# Patient Record
Sex: Male | Born: 1985 | Race: White | Hispanic: No | Marital: Married | Smoking: Never smoker
Health system: Southern US, Community
[De-identification: ages and names within clinical notes are randomized; demographics above are authoritative.]

## PROBLEM LIST (undated history)

## (undated) DIAGNOSIS — J45909 Unspecified asthma, uncomplicated: Secondary | ICD-10-CM

## (undated) HISTORY — DX: Unspecified asthma, uncomplicated: J45.909

---
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
## 2010-10-06 ENCOUNTER — Other Ambulatory Visit (HOSPITAL_COMMUNITY): Payer: Self-pay

## 2010-10-06 ENCOUNTER — Emergency Department (HOSPITAL_COMMUNITY): Payer: No Typology Code available for payment source

## 2010-10-06 ENCOUNTER — Inpatient Hospital Stay (HOSPITAL_COMMUNITY): Payer: No Typology Code available for payment source

## 2010-10-06 ENCOUNTER — Emergency Department (HOSPITAL_COMMUNITY): DRG: 963 | Payer: No Typology Code available for payment source | Attending: General Surgery

## 2010-10-06 ENCOUNTER — Inpatient Hospital Stay (HOSPITAL_COMMUNITY)
Admission: EM | Admit: 2010-10-06 | Discharge: 2010-10-10 | Disposition: A | Discharge status: Rehabilitation Facility | Payer: No Typology Code available for payment source | Source: Home / Self Care

## 2010-10-06 DIAGNOSIS — S12300A Unspecified displaced fracture of fourth cervical vertebra, initial encounter for closed fracture: Secondary | ICD-10-CM | POA: Diagnosis present

## 2010-10-06 DIAGNOSIS — S02109A Fracture of base of skull, unspecified side, initial encounter for closed fracture: Secondary | ICD-10-CM | POA: Diagnosis present

## 2010-10-06 DIAGNOSIS — J9600 Acute respiratory failure, unspecified whether with hypoxia or hypercapnia: Secondary | ICD-10-CM | POA: Diagnosis present

## 2010-10-06 DIAGNOSIS — J45909 Unspecified asthma, uncomplicated: Secondary | ICD-10-CM | POA: Diagnosis present

## 2010-10-06 DIAGNOSIS — S27329A Contusion of lung, unspecified, initial encounter: Secondary | ICD-10-CM | POA: Diagnosis present

## 2010-10-06 DIAGNOSIS — S065X9A Traumatic subdural hemorrhage with loss of consciousness of unspecified duration, initial encounter: Secondary | ICD-10-CM | POA: Diagnosis present

## 2010-10-06 LAB — BLOOD GAS, ARTERIAL
Acid-base deficit: 3.6 mmol/L — ABNORMAL HIGH (ref 0.0–2.0)
FIO2: 0.4 %
MECHVT: 550 mL
O2 Saturation: 97.4 %
Patient temperature: 100.5
RATE: 16 resp/min
TCO2: 22.5 mmol/L (ref 0–100)
pCO2 arterial: 42.8 mmHg (ref 35.0–45.0)

## 2010-10-06 LAB — BASIC METABOLIC PANEL
Chloride: 110 mEq/L (ref 96–112)
GFR calc Af Amer: >60 mL/min (ref >60)
GFR calc non Af Amer: >60 mL/min (ref >60)
Potassium: 4.3 mEq/L (ref 3.5–5.1)
Sodium: 140 mEq/L (ref 135–145)

## 2010-10-06 LAB — POCT I-STAT 3, ART BLOOD GAS (G3+)
Acid-base deficit: 2 mmol/L (ref 0.0–2.0)
O2 Saturation: 100 %
O2 Saturation: 96 %
Patient temperature: 98.8
TCO2: 24 mmol/L (ref 0–100)
TCO2: 24 mmol/L (ref 0–100)
pCO2 arterial: 54.8 mmHg — ABNORMAL HIGH (ref 35.0–45.0)
pCO2 arterial: 40.5 mmHg (ref 35.0–45.0)

## 2010-10-06 LAB — MRSA PCR SCREENING: MRSA by PCR: NEGATIVE

## 2010-10-06 LAB — POCT I-STAT, CHEM 8
BUN: 15 mg/dL (ref 6–23)
Creatinine, Ser: 1.6 mg/dL — ABNORMAL HIGH (ref 0.4–1.5)
Glucose, Bld: 192 mg/dL — ABNORMAL HIGH (ref 70–99)
Sodium: 140 meq/L (ref 135–145)
TCO2: 23 mmol/L (ref 0–100)

## 2010-10-06 LAB — CBC
Platelets: 205 10*3/uL (ref 150–400)
RBC: 4.88 MIL/uL (ref 4.22–5.81)
RDW: 12.8 % (ref 11.5–15.5)
WBC: 19.2 10*3/uL — ABNORMAL HIGH (ref 4.0–10.5)

## 2010-10-06 MED ORDER — IOHEXOL 300 MG/ML  SOLN
100.0000 mL | Freq: Once | INTRAMUSCULAR | Status: AC | PRN
  Administered 2010-10-06: 100 mL via INTRAVENOUS

## 2010-10-07 ENCOUNTER — Inpatient Hospital Stay (HOSPITAL_COMMUNITY): Payer: No Typology Code available for payment source

## 2010-10-07 LAB — CBC
Hemoglobin: 13.4 g/dL (ref 13.0–17.0)
MCH: 29.6 pg (ref 26.0–34.0)
MCV: 86.1 fL (ref 78.0–100.0)
RBC: 4.53 MIL/uL (ref 4.22–5.81)

## 2010-10-07 LAB — BLOOD GAS, ARTERIAL
Drawn by: 312761
O2 Content: 1 L/min
Patient temperature: 98.7
pCO2 arterial: 38.1 mmHg (ref 35.0–45.0)
pH, Arterial: 7.389 (ref 7.350–7.450)

## 2010-10-07 LAB — BASIC METABOLIC PANEL
CO2: 22 mEq/L (ref 19–32)
Calcium: 9 mg/dL (ref 8.4–10.5)
Chloride: 110 mEq/L (ref 96–112)
GFR calc non Af Amer: >60 mL/min (ref >60)
Glucose, Bld: 125 mg/dL — ABNORMAL HIGH (ref 70–99)

## 2010-10-08 DIAGNOSIS — S065X9A Traumatic subdural hemorrhage with loss of consciousness of unspecified duration, initial encounter: Secondary | ICD-10-CM

## 2010-10-08 LAB — BASIC METABOLIC PANEL
Calcium: 8.8 mg/dL (ref 8.4–10.5)
GFR calc non Af Amer: >60 mL/min (ref >60)
Glucose, Bld: 106 mg/dL — ABNORMAL HIGH (ref 70–99)
Potassium: 3.8 mEq/L (ref 3.5–5.1)
Sodium: 140 mEq/L (ref 135–145)

## 2010-10-08 LAB — CBC
MCHC: 34.2 g/dL (ref 30.0–36.0)
Platelets: 161 10*3/uL (ref 150–400)
RDW: 12.8 % (ref 11.5–15.5)
WBC: 11.4 10*3/uL — ABNORMAL HIGH (ref 4.0–10.5)
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
## 2010-10-10 ENCOUNTER — Inpatient Hospital Stay (HOSPITAL_COMMUNITY): Payer: BC Managed Care – PPO

## 2010-10-10 ENCOUNTER — Inpatient Hospital Stay (HOSPITAL_COMMUNITY)
Admission: RE | Admit: 2010-10-10 | Discharge: 2010-10-15 | DRG: 462 | Disposition: A | Discharge status: Home / Self Care | Payer: BC Managed Care – PPO | Source: Other Acute Inpatient Hospital | Attending: Physical Medicine & Rehabilitation | Admitting: Physical Medicine & Rehabilitation

## 2010-10-10 DIAGNOSIS — M545 Low back pain, unspecified: Secondary | ICD-10-CM | POA: Diagnosis present

## 2010-10-10 DIAGNOSIS — Z5189 Encounter for other specified aftercare: Principal | ICD-10-CM

## 2010-10-10 DIAGNOSIS — S069X9A Unspecified intracranial injury with loss of consciousness of unspecified duration, initial encounter: Secondary | ICD-10-CM

## 2010-10-10 DIAGNOSIS — R000 Tachycardia, unspecified: Secondary | ICD-10-CM | POA: Diagnosis present

## 2010-10-10 DIAGNOSIS — S27329A Contusion of lung, unspecified, initial encounter: Secondary | ICD-10-CM | POA: Diagnosis present

## 2010-10-10 DIAGNOSIS — F17200 Nicotine dependence, unspecified, uncomplicated: Secondary | ICD-10-CM | POA: Diagnosis present

## 2010-10-10 DIAGNOSIS — S12100A Unspecified displaced fracture of second cervical vertebra, initial encounter for closed fracture: Secondary | ICD-10-CM | POA: Diagnosis present

## 2010-10-10 DIAGNOSIS — S02109A Fracture of base of skull, unspecified side, initial encounter for closed fracture: Secondary | ICD-10-CM | POA: Diagnosis present

## 2010-10-10 DIAGNOSIS — Y998 Other external cause status: Secondary | ICD-10-CM

## 2010-10-10 DIAGNOSIS — J690 Pneumonitis due to inhalation of food and vomit: Secondary | ICD-10-CM | POA: Diagnosis present

## 2010-10-10 DIAGNOSIS — S22009A Unspecified fracture of unspecified thoracic vertebra, initial encounter for closed fracture: Secondary | ICD-10-CM

## 2010-10-10 DIAGNOSIS — S0280XA Fracture of other specified skull and facial bones, unspecified side, initial encounter for closed fracture: Secondary | ICD-10-CM

## 2010-10-10 DIAGNOSIS — Z4789 Encounter for other orthopedic aftercare: Secondary | ICD-10-CM

## 2010-10-10 DIAGNOSIS — S335XXA Sprain of ligaments of lumbar spine, initial encounter: Secondary | ICD-10-CM | POA: Diagnosis present

## 2010-10-10 DIAGNOSIS — S025XXA Fracture of tooth (traumatic), initial encounter for closed fracture: Secondary | ICD-10-CM | POA: Diagnosis present
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
## 2010-10-11 DIAGNOSIS — S22009A Unspecified fracture of unspecified thoracic vertebra, initial encounter for closed fracture: Secondary | ICD-10-CM

## 2010-10-11 DIAGNOSIS — S0280XA Fracture of other specified skull and facial bones, unspecified side, initial encounter for closed fracture: Secondary | ICD-10-CM

## 2010-10-11 DIAGNOSIS — S069X9A Unspecified intracranial injury with loss of consciousness of unspecified duration, initial encounter: Secondary | ICD-10-CM

## 2010-10-13 DIAGNOSIS — S22009A Unspecified fracture of unspecified thoracic vertebra, initial encounter for closed fracture: Secondary | ICD-10-CM

## 2010-10-13 DIAGNOSIS — S069X9A Unspecified intracranial injury with loss of consciousness of unspecified duration, initial encounter: Secondary | ICD-10-CM

## 2010-10-13 DIAGNOSIS — S0280XA Fracture of other specified skull and facial bones, unspecified side, initial encounter for closed fracture: Secondary | ICD-10-CM

## 2010-10-13 LAB — CBC
HCT: 43 % (ref 39.0–52.0)
Hemoglobin: 14.8 g/dL (ref 13.0–17.0)
MCH: 28.6 pg (ref 26.0–34.0)
MCHC: 34.4 g/dL (ref 30.0–36.0)
MCV: 83 fL (ref 78.0–100.0)
Platelets: 267 10*3/uL (ref 150–400)
RBC: 5.18 MIL/uL (ref 4.22–5.81)
RDW: 12.4 % (ref 11.5–15.5)
WBC: 11.2 10*3/uL — ABNORMAL HIGH (ref 4.0–10.5)

## 2010-10-13 LAB — COMPREHENSIVE METABOLIC PANEL
ALT: 50 U/L (ref 0–53)
BUN: 12 mg/dL (ref 6–23)
Calcium: 9.6 mg/dL (ref 8.4–10.5)
Creatinine, Ser: 0.85 mg/dL (ref 0.4–1.5)
Glucose, Bld: 109 mg/dL — ABNORMAL HIGH (ref 70–99)
Sodium: 141 mEq/L (ref 135–145)
Total Protein: 7.3 g/dL (ref 6.0–8.3)

## 2010-10-13 LAB — DIFFERENTIAL
Basophils Absolute: 0.1 10*3/uL (ref 0.0–0.1)
Basophils Relative: 0 % (ref 0–1)
Eosinophils Absolute: 0.2 10*3/uL (ref 0.0–0.7)
Eosinophils Relative: 2 % (ref 0–5)
Lymphocytes Relative: 16 % (ref 12–46)
Lymphs Abs: 1.8 10*3/uL (ref 0.7–4.0)
Monocytes Absolute: 0.8 10*3/uL (ref 0.1–1.0)
Monocytes Relative: 7 % (ref 3–12)
Neutro Abs: 8.4 10*3/uL — ABNORMAL HIGH (ref 1.7–7.7)
Neutrophils Relative %: 75 % (ref 43–77)

## 2010-10-14 ENCOUNTER — Inpatient Hospital Stay (HOSPITAL_COMMUNITY): Payer: BC Managed Care – PPO
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
## 2010-10-23 ENCOUNTER — Ambulatory Visit: Payer: BC Managed Care – PPO | Attending: Physical Medicine & Rehabilitation

## 2010-10-23 DIAGNOSIS — IMO0001 Reserved for inherently not codable concepts without codable children: Secondary | ICD-10-CM | POA: Insufficient documentation

## 2010-10-23 DIAGNOSIS — R41841 Cognitive communication deficit: Secondary | ICD-10-CM | POA: Insufficient documentation
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
## 2010-11-03 ENCOUNTER — Ambulatory Visit: Payer: BC Managed Care – PPO | Attending: Physical Medicine & Rehabilitation | Admitting: Speech Pathology

## 2010-11-03 DIAGNOSIS — R41841 Cognitive communication deficit: Secondary | ICD-10-CM | POA: Insufficient documentation

## 2010-11-03 DIAGNOSIS — IMO0001 Reserved for inherently not codable concepts without codable children: Secondary | ICD-10-CM | POA: Insufficient documentation

## 2010-11-05 LAB — TYPE AND SCREEN
ABO/RH(D): O POS
Unit division: 0

## 2010-11-05 LAB — PROTIME-INR: Prothrombin Time: 13.9 s (ref 11.6–15.2)

## 2010-11-05 LAB — COMPREHENSIVE METABOLIC PANEL
ALT: 199 U/L — ABNORMAL HIGH (ref 0–53)
Alkaline Phosphatase: 60 U/L (ref 39–117)
BUN: 12 mg/dL (ref 6–23)
CO2: 24 meq/L (ref 19–32)
Calcium: 7.9 mg/dL — ABNORMAL LOW (ref 8.4–10.5)
GFR calc non Af Amer: >60 mL/min (ref >60)
Glucose, Bld: 202 mg/dL — ABNORMAL HIGH (ref 70–99)
Potassium: 3.5 meq/L (ref 3.5–5.1)
Total Protein: 5.9 g/dL — ABNORMAL LOW (ref 6.0–8.3)

## 2010-11-05 LAB — CBC
HCT: 41.4 % (ref 39.0–52.0)
MCH: 29.2 pg (ref 26.0–34.0)
MCV: 84.5 fL (ref 78.0–100.0)
Platelets: 251 10*3/uL (ref 150–400)
RDW: 12.5 % (ref 11.5–15.5)

## 2010-11-05 LAB — LACTIC ACID, PLASMA: Lactic Acid, Venous: 3.3 mmol/L — ABNORMAL HIGH (ref 0.5–2.2)

## 2010-11-10 DIAGNOSIS — R413 Other amnesia: Secondary | ICD-10-CM

## 2010-11-10 DIAGNOSIS — S069X9A Unspecified intracranial injury with loss of consciousness of unspecified duration, initial encounter: Secondary | ICD-10-CM

## 2010-12-01 ENCOUNTER — Encounter
Payer: BC Managed Care – PPO | Attending: Physical Medicine & Rehabilitation | Admitting: Physical Medicine & Rehabilitation

## 2010-12-01 DIAGNOSIS — S12300A Unspecified displaced fracture of fourth cervical vertebra, initial encounter for closed fracture: Secondary | ICD-10-CM

## 2010-12-01 DIAGNOSIS — R413 Other amnesia: Secondary | ICD-10-CM | POA: Insufficient documentation

## 2010-12-01 DIAGNOSIS — S069X9A Unspecified intracranial injury with loss of consciousness of unspecified duration, initial encounter: Secondary | ICD-10-CM | POA: Insufficient documentation

## 2010-12-01 DIAGNOSIS — S82209A Unspecified fracture of shaft of unspecified tibia, initial encounter for closed fracture: Secondary | ICD-10-CM | POA: Insufficient documentation
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
## 2021-06-25 NOTE — Progress Notes (Signed)
NEW PATIENT Date of Service/Encounter:  06/30/21 Referring provider: Kem Boroughs, MD Primary care provider: Kem Boroughs, MD  Subjective:  [place] Holloway is a 35 y.o. male with a PMHx of restless leg syndrome,  presenting today for evaluation of concern for ant bites. History obtained from: chart review and patient.   Ant bites:  Has had several ant bites which lead to swelling occurring several hours after bite.  Usually will get bit on hands from wearing gloves and doing yard work.  Most recently, also developed difficult breathing and full extremity pain and swelling.  He takes Benadryl as soon as he gets bit. He works as a Therapist, occupational. He does not carry an Epipen. He has been stung by wasp last year and developed a localized welp-but no other symptoms.  He does take Claritin for seasonal allergies.  Gets itchy eyes.  This is the main symptom.  Also gets nasal congestion.  Also gets itchy sometimes from touching random things such as the carpet.  Feels pretty controlled with the Claritin.  Has history of childhood asthma, but no longer an active issue.  He has not used albuterol in many years, since before his teenage years.  He does have extremely dry skin during winter months.  He uses an ointment - Cetaphil.  He has a newborn baby in his home and has been washing his hands constantly.  This is caused his hands to become dry, crack and bleed.  This is a chronic issue that is only in problem during winter and cold months.  He never gets red bumps or blisters on his hands-just dryness and cracking.  Other allergy screening: Food allergy: no   Past Medical History: Past Medical History:  Diagnosis Date   Asthma    Medication List:  Current Outpatient Medications  Medication Sig Dispense Refill   clobetasol ointment (TEMOVATE) 0.05 % Apply topically twice daily to BODY as needed for SEVERE red, sandpaper and thickened like rash.  Do not use on face, groin or armpits.  Use  for up to two weeks at a time. 60 g 1   EPINEPHrine (EPIPEN 2-PAK) 0.3 mg/0.3 mL IJ SOAJ injection Inject 0.3 mg into the muscle once for 1 dose. 1 each 1   Loratadine (CLARITIN) 10 MG CAPS Take by mouth.     Multiple Vitamin (MULTI-VITAMIN) tablet Take 1 tablet by mouth daily.     No current facility-administered medications for this visit.   Known Allergies:  Not on File Past Surgical History: Past Surgical History:  Procedure Laterality Date   WISDOM TOOTH EXTRACTION Bilateral    Family History: Family History  Problem Relation Age of Onset   Asthma Mother    Migraines Mother    Asthma Maternal Grandmother    Allergic rhinitis Neg Hx    Eczema Neg Hx    Immunodeficiency Neg Hx    Urticaria Neg Hx    Angioedema Neg Hx    Social History: [place] lives In a house built 14 years ago, no water damage, carpet in the bedroom, wood and heat pump for heating, heat pump for cooling, pets dog and cat indoors, chickens outdoor, no pests, not using dust mite protection, no smoke exposure.  He works as a Engineer, structural for 4 years.  No HEPA filter.  [place]   ROS:  All other systems negative except as noted per HPI.  Objective:  Blood pressure 112/76, pulse 98, temperature 98.2 F (36.8 C), temperature  source Temporal, resp. rate 16, height 6\' 1"  (1.854 m), weight 228 lb 3.2 oz (103.5 kg), SpO2 99 %. Body mass index is 30.11 kg/m. Physical Exam:  General Appearance:  Alert, cooperative, no distress, appears stated age  Head:  Normocephalic, without obvious abnormality, atraumatic  Eyes:  Conjunctiva clear, EOM's intact  Nose: Nares normal, bilateral hypertrophic turbinates, no visible anterior polyps  Throat: Lips, tongue normal; teeth and gums normal, normal posterior oropharynx  Neck: Supple, symmetrical  Lungs:   Clear to auscultation bilaterally, respirations unlabored, no coughing  Heart:  Regular rate and rhythm, no murmur, appears well  perfused  Extremities: No edema  Skin: Skin color, texture, turgor normal, no rashes or lesions on visualized portions of skin  Neurologic: No gross deficits     Diagnostics: Spirometry:  Tracings reviewed. His effort: Good reproducible efforts. FVC: 5.44L FEV1: 4.07L, 84% predicted FEV1/FVC ratio: 91% Interpretation: Spirometry consistent with normal pattern.  Please see scanned spirometry results for details.  Skin Testing:  Environmental allergy panel plus fire ant . Positive test to: Fire ants, weeds, grasses, trees, molds, dust mites, cats and dogs, mixed feathers. Negative test to: All others.  Results discussed with patient/family.  Airborne Adult Perc - 06/30/21 1501     Time Antigen Placed --    Allergen Manufacturer --    Location --    Number of Test --    Panel 1 --             Food Adult Perc - 06/30/21 1400     Time Antigen Placed 0230    Allergen Manufacturer 07/02/21    Location Back    Number of allergen test 1    6. Other 3+   fire ant   7. Other Omitted    8. Other Omitted             Allergy testing results were read and interpreted by myself, documented by clinical staff.  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 - recommend allergy shots directed toward fire ants - please carry Epinephrine autoinjector at all times in case of accidental sting, to be used if stung and has SKIN AND ANY OTHER SYMPTOMS - for SKIN only, can take Benadryl 2 tsp (25 mg)  - recommend medical alert bracelet - practice avoidance measures when possible   Chronic Rhinitis and Conjunctivitis: -Seasonal and perennial allergic rhinitis, partially controlled - allergy testing today was positive to weeds, grasses, trees, molds, dust mites, cats and dogs, mixed feathers - allergen avoidance as below - consider allergy shots as long term control of your symptoms by teaching your immune system to be  more tolerant of your allergy triggers - Continue over the counter antihistamine daily or daily as needed.   -Your options include Zyrtec (Cetirizine) 10mg , Claritin (Loratadine) 10mg , Allegra (Fexofenadine) 180mg , or Xyzal (Levocetirinze) 5mg  - if not controlled, consider nasal steroid spray such as   Allergic Conjunctivitis: Partially controlled - Consider Allergy Eye drops: great options include Pataday (Olopatadine) or Zaditor (ketotifen) for eye symptoms daily as needed-both sold over the counter if not covered by insurance.   -Avoid eye drops that say red eye relief as they may contain medications that dry out your eyes.  Hand Dermatitis:  -Uncontrolled Daily Care For Maintenance (daily and continue even once eczema controlled) - Use hypoallergenic hydrating ointment at least twice daily.  This must be done daily for control of  flares. (Great options include Vaseline, CeraVe, Aquaphor, Aveeno, Cetaphil, etc) - Avoid detergents, soaps or lotions with fragrances/dyes - Limit showers/baths to 5 minutes and use luke warm water instead of hot, pat dry following baths, and apply moisturizer - can use steroid creams as detailed below up to twice weekly for prevention of flares.  For Flares:(add this to maintenance therapy if needed for flares) First apply steroid creams. Wait 5 minutes then apply moisturizer.  - Clobetasol 0.05% to hands for severe flares-apply topically twice daily to red, raised, thickened areas of skin, followed by moisturizer. Do not use on face, groin or armpits - for itching, can increase to 2 daily claritin-one in morning and one in evening  Follow-up in 3 months.  Sooner for allergy shots to fire ant. - This note in its entirety was forwarded to the Provider who requested this consultation.  Thank you for your kind referral. I appreciate the opportunity to take part in [place]ly's care. Please do not hesitate to contact me with questions.  Sincerely,  Tonny Bollman,  MD [facility]

## 2021-06-30 ENCOUNTER — Ambulatory Visit (INDEPENDENT_AMBULATORY_CARE_PROVIDER_SITE_OTHER): Payer: BC Managed Care – PPO | Admitting: Internal Medicine

## 2021-06-30 ENCOUNTER — Encounter: Payer: Self-pay | Admitting: Internal Medicine

## 2021-06-30 ENCOUNTER — Other Ambulatory Visit: Payer: Self-pay

## 2021-06-30 VITALS — BP 112/76 | HR 98 | Temp 98.2°F | Resp 16 | Ht 73.0 in | Wt 228.2 lb

## 2021-06-30 DIAGNOSIS — T782XXA Anaphylactic shock, unspecified, initial encounter: Secondary | ICD-10-CM

## 2021-06-30 DIAGNOSIS — J4520 Mild intermittent asthma, uncomplicated: Secondary | ICD-10-CM

## 2021-06-30 DIAGNOSIS — T63481A Toxic effect of venom of other arthropod, accidental (unintentional), initial encounter: Secondary | ICD-10-CM | POA: Diagnosis not present

## 2021-06-30 DIAGNOSIS — J310 Chronic rhinitis: Secondary | ICD-10-CM | POA: Diagnosis not present

## 2021-06-30 DIAGNOSIS — J3089 Other allergic rhinitis: Secondary | ICD-10-CM

## 2021-06-30 DIAGNOSIS — J302 Other seasonal allergic rhinitis: Secondary | ICD-10-CM | POA: Insufficient documentation

## 2021-06-30 MED ORDER — CLOBETASOL PROPIONATE 0.05 % EX OINT
TOPICAL_OINTMENT | CUTANEOUS | 1 refills | Status: DC
Start: 2021-06-30 — End: 2021-10-02

## 2021-06-30 MED ORDER — EPINEPHRINE 0.3 MG/0.3ML IJ SOAJ: 0.3000 mg | Freq: Once | INTRAMUSCULAR | 1 refills | Status: AC

## 2021-06-30 NOTE — Patient Instructions (Addendum)
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 - recommend allergy shots directed toward fire ants - please carry Epinephrine autoinjector at all times in case of accidental sting, to be used if stung and has SKIN AND ANY OTHER SYMPTOMS - for SKIN only, can take Benadryl 2 tsp (25 mg)  - recommend medical alert bracelet - practice avoidance measures when possible   Chronic Rhinitis and Conjunctivitis: - allergy testing today was positive to weeds, grasses, trees, molds, dust mites, cats and dogs, mixed feathers - allergen avoidance as below - consider allergy shots as long term control of your symptoms by teaching your immune system to be more tolerant of your allergy triggers - Continue over the counter antihistamine daily or daily as needed.   -Your options include Zyrtec (Cetirizine) 10mg , Claritin (Loratadine) 10mg , Allegra (Fexofenadine) 180mg , or Xyzal (Levocetirinze) 5mg  - if not controlled, consider nasal steroid spray such as   Allergic Conjunctivitis:  - Consider Allergy Eye drops: great options include Pataday (Olopatadine) or Zaditor (ketotifen) for eye symptoms daily as needed-both sold over the counter if not covered by insurance.   -Avoid eye drops that say red eye relief as they may contain medications that dry out your eyes.  Hand Dermatitis:  Daily Care For Maintenance (daily and continue even once eczema controlled) - Use hypoallergenic hydrating ointment at least twice daily.  This must be done daily for control of flares. (Great options include Vaseline, CeraVe, Aquaphor, Aveeno, Cetaphil, etc) - Avoid detergents, soaps or lotions with fragrances/dyes - Limit showers/baths to 5 minutes and use luke warm water instead of hot, pat dry following baths, and apply moisturizer - can use steroid creams as detailed below up to twice weekly for prevention of flares.  For Flares:(add this to maintenance therapy if needed for flares) First apply steroid  creams. Wait 5 minutes then apply moisturizer.  - Clobetasol 0.05% to hands for severe flares-apply topically twice daily to red, raised, thickened areas of skin, followed by moisturizer. Do not use on face, groin or armpits - for itching, can increase to 2 daily claritin-one in morning and one in evening  Follow-up in 3 months. ------------------------------------------------------------------------------- Reducing Pollen Exposure  The American Academy of Allergy, Asthma and Immunology suggests the following steps to reduce your exposure to pollen during allergy seasons.    Do not hang sheets or clothing out to dry; pollen may collect on these items. Do not mow lawns or spend time around freshly cut grass; mowing stirs up pollen. Keep windows closed at night.  Keep car windows closed while driving. Minimize morning activities outdoors, a time when pollen counts are usually at their highest. Stay indoors as much as possible when pollen counts or humidity is high and on windy days when pollen tends to remain in the air longer. Use air conditioning when possible.  Many air conditioners have filters that trap the pollen spores. Use a HEPA room air filter to remove pollen form the indoor air you breathe.  Control of Dog or Cat Allergen  Avoidance is the best way to manage a dog or cat allergy. If you have a dog or cat and are allergic to dog or cats, consider removing the dog or cat from the home. If you have a dog or cat but don't want to find it a new home, or if your family wants a pet even though someone in the household is allergic, here are some strategies that may help keep symptoms at bay:  Keep the pet out of your bedroom and restrict it to only a few rooms. Be advised that keeping the dog or cat in only one room will not limit the allergens to that room. Don't pet, hug or kiss the dog or cat; if you do, wash your hands with soap and water. High-efficiency particulate air (HEPA)  cleaners run continuously in a bedroom or living room can reduce allergen levels over time. Regular use of a high-efficiency vacuum cleaner or a central vacuum can reduce allergen levels. Giving your dog or cat a bath at least once a week can reduce airborne allergen.  DUST MITE AVOIDANCE MEASURES:  There are three main measures that need and can be taken to avoid house dust mites:  Reduce accumulation of dust in general -reduce furniture, clothing, carpeting, books, stuffed animals, especially in bedroom  Separate yourself from the dust -use pillow and mattress encasements (can be found at stores such as [employer] or online) -avoid direct exposure to air condition flow -use a HEPA filter device, especially in the bedroom; you can also use a HEPA filter vacuum cleaner -wipe dust with a moist towel instead of a dry towel or broom when cleaning  Decrease mites and/or their secretions -wash clothing and linen and stuffed animals at highest temperature possible, at least every 2 weeks -stuffed animals can also be placed in a bag and put in a freezer overnight  Despite the above measures, it is impossible to eliminate dust mites or their allergen completely from your home.  With the above measures the burden of mites in your home can be diminished, with the goal of minimizing your allergic symptoms.  Success will be reached only when implementing and using all means together.

## 2021-07-01 NOTE — Progress Notes (Signed)
Aeroallergen Immunotherapy   Ordering Provider: Dr. Tonny Bollman   Patient Details  Name: VUE PAVON  MRN: [id number]  Date of Birth: 01-Mar-1986   Order 1 of 1   Vial Label: Fire Ant   0.5 ml (Volume)  1:20 Concentration -- Fire Ant    0.5  ml Extract Subtotal  4.5  ml Diluent  5.0  ml Maintenance Total   Schedule:  B  Blue Vial (1:100,000): Schedule B (6 doses)  Yellow Vial (1:10,000): Schedule B (6 doses)  Green Vial (1:1,000): Schedule B (6 doses)  Red Vial (1:100): Schedule A (10 doses)   Special Instructions: normal B schedule

## 2021-07-01 NOTE — Progress Notes (Signed)
VIALS NOT MADE. APPT NOT SCHED °

## 2021-07-02 ENCOUNTER — Other Ambulatory Visit: Payer: Self-pay

## 2021-07-02 MED ORDER — EPINEPHRINE 0.3 MG/0.3ML IJ SOAJ: 0.3000 mg | Freq: Once | INTRAMUSCULAR | 1 refills | Status: AC

## 2021-07-14 ENCOUNTER — Ambulatory Visit: Payer: BC Managed Care – PPO | Admitting: Internal Medicine

## 2021-09-30 NOTE — Progress Notes (Signed)
? ?FOLLOW UP ?Date of Service/Encounter:  10/02/21 ? ? ?Subjective:  ?Bradley Holloway (DOB: Aug 06, 1985) is a 36 y.o. male who [facility] in re-evaluation of the following: fire ant allergy, seasonal and perennial allergic rhinitis and conjunctivitis, hand dermatitis ?History obtained from: chart review and patient. ? ?For Review, LV was on 06/30/21  with Dr.Kasyn Rolph.  Recommended fire ant allergy shots based on testing.   ? ? ?Pertinent History/Diagnostics:  ?- Asthma:  ? - 06/30/21 spirometry  ratio 91%, 84% FEV1  ?- Allergic Rhinitis:  ? - SPT environmental panel (06/30/21): weeds, grasses, trees, molds, dust mites, cats and dogs, mixed feathers. ?- Fire [employer] Allergy ? - Hx of reaction: swelling, hives, difficult breathing ? - SPT fire ant (06/30/21): 3+ ? ?Today he presents for follow-up. ?He has not had any fire ant bites since his last visit.  He has checked with his insurance and is going to start allergy shots for fire ant.  However he starts a new job and is waiting to get his final schedule so that he can make his first appointment. ?His environmental allergies have been overall well controlled with Zyrtec and Flonase 1 spray in each nostril daily.   ?He is getting shortness of breath when drastic weather changes or if really humid it makes him feels like his chest is heavy and the air is dense.  He has not had an albuterol inhaler in many years but did have asthma as a child and is wondering if he should carry 1 of these. ? ?Allergies as of 10/02/2021   ? ?   Reactions  ? Fire [employer]   ? Joint pain, difficult breathing  ? ?  ? ?  ?Medication List  ?  ? ?  ? Accurate as of October 02, 2021  6:16 PM. If you have any questions, ask your nurse or doctor.  ?  ?  ? ?  ? ?clobetasol ointment 0.05 % ?Commonly known as: TEMOVATE ?Apply topically twice daily to BODY as needed for SEVERE red, sandpaper and thickened like rash.  Do not use on face, groin or armpits.  Use for up to two  weeks at a time. ?  ?Loratadine 10 MG Caps ?Take by mouth. ?  ?Multi-Vitamin tablet ?Take 1 tablet by mouth daily. ?  ? ?  ? ?Past Medical History:  ?Diagnosis Date  ? Asthma   ? ?Past Surgical History:  ?Procedure Laterality Date  ? WISDOM TOOTH EXTRACTION Bilateral   ? ?Otherwise, there have been no changes to his past medical history, surgical history, family history, or social history. ? ?ROS: All others negative except as noted per HPI.  ? ?Objective:  ?BP 128/84 (BP Location: Right Arm, Patient Position: Sitting, Cuff Size: Normal)   Pulse 80   Temp 97.7 ?F (36.5 ?C) (Temporal)   Resp 20   Ht 6' (1.829 m)   Wt 232 lb 11.2 oz (105.6 kg)   SpO2 98%   BMI 31.56 kg/m?  ?Body mass index is 31.56 kg/m?Marland Kitchen ?Physical Exam: ?General Appearance:  Alert, cooperative, no distress, appears stated age  ?Head:  Normocephalic, without obvious abnormality, atraumatic  ?Eyes:  Conjunctiva clear, EOM's intact  ?Nose: Nares normal, hypertrophic turbinates, normal mucosa, no visible anterior polyps, and septum midline  ?Throat: Lips, tongue normal; teeth and gums normal, normal posterior oropharynx  ?Neck: Supple, symmetrical  ?Lungs:   clear to auscultation bilaterally, Respirations unlabored, no coughing  ?Heart:  regular rate  and rhythm and no murmur, Appears well perfused  ?Extremities: No edema  ?Skin: Skin color, texture, turgor normal, no rashes or lesions on visualized portions of skin  ?Neurologic: No gross deficits  ? ?Assessment/Plan  ? 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 ?Seasonal and perennial allergic rhinitis: ?- allergy testing was  positive to weeds, grasses, trees, molds, dust mites, cats and dogs, mixed feathers ?- allergen avoidance as below ?- consider allergy shots as long term control of your symptoms by teaching your immune system to be more tolerant of your allergy triggers ?- Continue over the counter antihistamine daily or daily as needed.   ?-Your options include Zyrtec (Cetirizine) 10mg , Claritin (Loratadine) 10mg , Allegra (Fexofenadine) 180mg , or Xyzal (Levocetirinze) 5mg  ?- Continue Flonase (fluticasone) 1-2 sprays daily as needed, best if used daily ? ?Allergic Conjunctivitis: controlled ?- Consider Allergy Eye drops: great options include Pataday (Olopatadine) or Zaditor (ketotifen) for eye symptoms daily as needed-both sold over the counter if not covered by insurance.   ?-Avoid eye drops that say red eye relief as they may contain medications that dry out your eyes. ? ?Hand Dermatitis: controlled ?Daily Care For Maintenance (daily and continue even once eczema controlled) ?- Use hypoallergenic hydrating ointment at least twice daily.  This must be done daily for control of flares. (Great options include Vaseline, CeraVe, Aquaphor, Aveeno, Cetaphil, etc) ?- Avoid detergents, soaps or lotions with fragrances/dyes ?- Limit showers/baths to 5 minutes and use luke warm water instead of hot, pat dry following baths, and apply moisturizer ?- can use steroid creams as detailed below up to twice weekly for prevention of flares. ? ?For Flares:(add this to maintenance therapy if needed for flares) ?First apply steroid creams. Wait 5 minutes then apply moisturizer.  ?- Clobetasol 0.05% to hands for severe flares-apply topically twice daily to red, raised, thickened areas of skin, followed by moisturizer. Do not use on face, groin or armpits ?- for itching, can increase to 2 daily claritin-one in morning and one in evening ? ?Intermittent Asthma: controlled ?- Rescue Inhaler: Albuterol (Proair/Ventolin) 2 puffs . Use  every 4-6 hours as  needed for chest tightness, wheezing, or coughing.  Can also use 15 minutes prior to exercise if you have symptoms with activity. ?- Asthma is not controlled if: ? - Symptoms are occurring >2 times a week OR ? - >2 times a month nighttime awakenings ? - You are requiring systemic steroids (prednisone/steroid injections) more than once per year ? - Your require hospitalization for your asthma. ? - Please call the clinic to schedule a follow up if these symptoms arise ? ?Follow-up in 3 months, sooner if needed. ?It was a pleasure seeing you again today! ? ? , MD  ?[facility] ? ? ? ? ? ? ?

## 2021-10-02 ENCOUNTER — Encounter: Payer: Self-pay | Admitting: Internal Medicine

## 2021-10-02 ENCOUNTER — Ambulatory Visit: Payer: BC Managed Care – PPO | Admitting: Internal Medicine

## 2021-10-02 ENCOUNTER — Other Ambulatory Visit: Payer: Self-pay

## 2021-10-02 VITALS — BP 128/84 | HR 80 | Temp 97.7°F | Resp 20 | Ht 72.0 in | Wt 232.7 lb

## 2021-10-02 DIAGNOSIS — J3089 Other allergic rhinitis: Secondary | ICD-10-CM | POA: Diagnosis not present

## 2021-10-02 DIAGNOSIS — J4520 Mild intermittent asthma, uncomplicated: Secondary | ICD-10-CM | POA: Diagnosis not present

## 2021-10-02 DIAGNOSIS — J302 Other seasonal allergic rhinitis: Secondary | ICD-10-CM

## 2021-10-02 DIAGNOSIS — T63421D Toxic effect of venom of ants, accidental (unintentional), subsequent encounter: Secondary | ICD-10-CM

## 2021-10-02 DIAGNOSIS — T782XXA Anaphylactic shock, unspecified, initial encounter: Secondary | ICD-10-CM

## 2021-10-02 DIAGNOSIS — T63481A Toxic effect of venom of other arthropod, accidental (unintentional), initial encounter: Secondary | ICD-10-CM

## 2021-10-02 MED ORDER — CLOBETASOL PROPIONATE 0.05 % EX OINT: TOPICAL_OINTMENT | CUTANEOUS | 1 refills | Status: AC

## 2021-10-02 MED ORDER — ALBUTEROL SULFATE HFA 108 (90 BASE) MCG/ACT IN AERS: 2.0000 | INHALATION_SPRAY | Freq: Four times a day (QID) | RESPIRATORY_TRACT | 2 refills | Status: AC | PRN

## 2021-10-02 NOTE — Patient Instructions (Addendum)
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 ?Chronic Rhinitis and Conjunctivitis: ?- allergy testing was positive to weeds, grasses, trees, molds, dust mites, cats and dogs, mixed feathers ?- allergen avoidance as below ?- consider allergy shots as long term control of your symptoms by teaching your immune system to be more tolerant of your allergy triggers ?- Continue over the counter antihistamine daily or daily as needed.   ?-Your options include Zyrtec (Cetirizine) 10mg , Claritin (Loratadine) 10mg , Allegra (Fexofenadine) 180mg , or Xyzal (Levocetirinze) 5mg  ?- Continue Flonase (fluticasone) 1-2 sprays daily as needed, best if used daily ? ?Allergic Conjunctivitis:  ?- Consider Allergy Eye drops: great options include Pataday (Olopatadine) or Zaditor (ketotifen) for eye symptoms daily as needed-both sold over the counter if not covered by insurance.   ?-Avoid eye drops that say red eye relief as they may contain medications that dry out your eyes. ? ?Hand Dermatitis:  ?Daily Care For Maintenance (daily and continue even once eczema controlled) ?- Use hypoallergenic hydrating ointment at least twice daily.  This must be done daily for control of flares. (Great options include Vaseline, CeraVe, Aquaphor, Aveeno, Cetaphil, etc) ?- Avoid detergents, soaps or lotions with fragrances/dyes ?- Limit showers/baths to 5 minutes and use luke warm water instead of hot, pat dry following baths, and apply moisturizer ?- can use steroid creams as detailed below up to twice weekly for prevention of flares. ? ?For Flares:(add this to maintenance therapy if needed for  flares) ?First apply steroid creams. Wait 5 minutes then apply moisturizer.  ?- Clobetasol 0.05% to hands for severe flares-apply topically twice daily to red, raised, thickened areas of skin, followed by moisturizer. Do not use on face, groin or armpits ?- for itching, can increase to 2 daily claritin-one in morning and one in evening ? ?Follow-up in 3 months, sooner if needed. ?It was a pleasure seeing you again today! ?------------------------------------------------------------------------------- ?Reducing Pollen Exposure ? ?The American Academy of Allergy, Asthma and Immunology suggests the following steps to reduce your exposure to pollen during allergy seasons. ?   ?Do not hang sheets or clothing out to dry; pollen may collect on these items. ?Do not mow lawns or spend time around freshly cut grass; mowing stirs up pollen. ?Keep windows closed at night.  Keep car windows closed while driving. ?Minimize morning activities outdoors, a time when pollen counts are usually at their highest. ?Stay indoors as much as possible when pollen counts or humidity is high and on windy days when pollen tends to remain in the air longer. ?Use air conditioning when possible.  Many air conditioners have filters that trap the pollen spores. ?Use a HEPA room air filter to remove pollen form the indoor air you breathe. ? ?Control of Dog or Cat Allergen ? ?Avoidance is the best way to manage a dog or cat allergy. If you have a dog or cat and are allergic to dog or cats, consider removing the dog or cat from the home. ?If you have a dog or cat but don?t want to find it a new home, or if your family wants a pet even though someone  in the household is allergic, here are some strategies that may help keep symptoms at bay: ? ?Keep the pet out of your bedroom and restrict it to only a few rooms. Be advised that keeping the dog or cat in only one room will not limit the allergens to that room. ?Don?t pet, hug or kiss the dog or cat; if  you do, wash your hands with soap and water. ?High-efficiency particulate air (HEPA) cleaners run continuously in a bedroom or living room can reduce allergen levels over time. ?Regular use of a high-efficiency vacuum cleaner or a central vacuum can reduce allergen levels. ?Giving your dog or cat a bath at least once a week can reduce airborne allergen. ? ?DUST MITE AVOIDANCE MEASURES: ? ?There are three main measures that need and can be taken to avoid house dust mites: ? ?Reduce accumulation of dust in general ?-reduce furniture, clothing, carpeting, books, stuffed animals, especially in bedroom ? ?Separate yourself from the dust ?-use pillow and mattress encasements (can be found at stores such as [employer] or online) ?-avoid direct exposure to air condition flow ?-use a HEPA filter device, especially in the bedroom; you can also use a HEPA filter vacuum cleaner ?-wipe dust with a moist towel instead of a dry towel or broom when cleaning ? ?Decrease mites and/or their secretions ?-wash clothing and linen and stuffed animals at highest temperature possible, at least every 2 weeks ?-stuffed animals can also be placed in a bag and put in a freezer overnight ? ?Despite the above measures, it is impossible to eliminate dust mites or their allergen completely from your home.  With the above measures the burden of mites in your home can be diminished, with the goal of minimizing your allergic symptoms.  Success will be reached only when implementing and using all means together. ? ? ?

## 2021-10-06 NOTE — Progress Notes (Signed)
VIALS NOT MADE UNTIL APPT IS MADE. 

## 2021-10-06 NOTE — Progress Notes (Signed)
Aeroallergen Immunotherapy  ? ?Ordering Provider: Dr. Tonny Bollman  ? ?Patient Details  ?Name: Bradley Holloway  ?MRN: [id number]  ?Date of Birth: February 16, 1986  ? ?Order 1 of 1  ? ?Vial Label: fire ant  ? ?0.5 ml (Volume)  1:20 Concentration -- Fire Ant  ? ? ?0.5  ml Extract Subtotal  ?4.5  ml Diluent  ?5.0  ml Maintenance Total  ? ?Schedule:  B  ? ?Blue Vial (1:100,000): Schedule B (6 doses)  ?Yellow Vial (1:10,000): Schedule B (6 doses)  ?Green Vial (1:1,000): Schedule B (6 doses)  ?Red Vial (1:100): Schedule A (10 doses)  ? ?Special Instructions: normal fire ant schedule ?
